# Patient Record
Sex: Female | Born: 2000 | Race: Asian | Hispanic: No | Marital: Single | Smoking: Never smoker
Health system: Southern US, Community
[De-identification: ages and names within clinical notes are randomized; demographics above are authoritative.]

## PROBLEM LIST (undated history)

## (undated) DIAGNOSIS — L709 Acne, unspecified: Secondary | ICD-10-CM

## (undated) DIAGNOSIS — R6252 Short stature (child): Secondary | ICD-10-CM

## (undated) HISTORY — DX: Acne, unspecified: L70.9

## (undated) HISTORY — DX: Short stature (child): R62.52

## (undated) HISTORY — PX: NO PAST SURGERIES: SHX2092

---
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
## 2019-07-23 NOTE — Progress Notes (Signed)
This visit occurred during the SARS-CoV-2 public health emergency.  Safety protocols were in place, including screening questions prior to the visit, additional usage of staff PPE, and extensive cleaning of exam room while observing appropriate contact time as indicated for disinfecting solutions.    Chief Complaint  Patient presents with  . Diarrhea    pt states that she has been dealing with constipation and diarrhea since july and then saturday had such sharp stomach pain after working out and has tried different diets to see if it was food    HPI: Megan Warren 19 y.o. 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 Changed diet ? If some help.  No fever weight loss blood or cramps  With this  And no travel this year . No one at hoome is sick  And had no predated diarreal illness   She also  noted   hormonal acne cheek and id diet changes  .       And last week better    .  From  all  Saturday Jan 9th had episode after a little diarrhjea.    Light headed after exercise ( had only had some celery juice that am but not with exercise)  And had acte lower abd cramps nausea and episode of  vomiting pain waxed and waned  .    Called paramedics were called    But had improved by time came  Felt  Pins and needles in hands.    VS was ok This was a High  intensity  Work out   .  Last time 3 weeks   Episode happened atCool down.    Now feels normal.   Period began that day  On time    LMp  Saturday .    Mild  cramps  A couple years ago  Had similar pain with FP in [place].   [place] stool chart   Diarrhea   Pieces  Granular.  Once in am  And then next day  Then small hard to pass.    menarche in 6 grade no hx of sever cramps  No uti vag sx and denies risk for sti .  ROS: See pertinent positives and negatives per HPI. No weight changes  And feels healthy otherwise   Past Medical History:  Diagnosis Date  . Short stature, familial     Family History  Problem Relation Age of Onset  . Short stature Unknown        sister and mother    Social History   Socioeconomic History  . Marital status: Single    Spouse name: Not on file  . Number of children: Not on file  . Years of education: Not on file  . Highest education level: Not on file  Occupational History  . Not on file  Tobacco Use  . Smoking status: Never Smoker  . Smokeless tobacco: Never Used  Substance and Sexual Activity  . Alcohol use: No  . Drug use: No  . Sexual activity: Never  Other Topics Concern  . Not on file  Social History Narrative   Born in [place]   [facility] of 2   Senior at [employer] in 2018 then plans to pursue college   Pet labrador   Neg ets firearms   Parents Vandana Daleen Bo Kilker   Mom MS pharma    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   [employer] student    Social Determinants of Health   Financial Resource Strain:   . Difficulty of Paying Living Expenses: Not on file  Food Insecurity:   . Worried About Programme researcher, broadcasting/film/video in the Last Year: Not on file  . Ran Out of Food in the Last Year: Not on file  Transportation Needs:   . Lack of Transportation (Medical): Not on file  . Lack of Transportation (Non-Medical): Not on file  Physical Activity:   . Days of Exercise per Week: Not on file  . Minutes of Exercise per Session: Not on file  Stress:   . Feeling of Stress : Not on file  Social Connections:   . Frequency of Communication with Friends and Family: Not on file  . Frequency of Social Gatherings with Friends and Family: Not on file  . Attends Religious Services: Not on file  . Active Member of  Clubs or Organizations: Not on file  . Attends Banker Meetings: Not on file  . Marital Status: Not on file    No outpatient medications prior to visit.   No facility-administered medications prior to visit.     EXAM:  BP 116/60 (BP Location: Right Arm, Patient Position: Sitting, Cuff Size: Normal)   Pulse (!) 105   Temp 98 F (36.7 C) (Temporal)   Ht 4\' 7"  (1.397 m)   Wt 73 lb 9.6 oz (33.4 kg)   LMP 07/21/2019 (Exact Date)   SpO2 98%   BMI 17.11 kg/m   Body mass index is 17.11 kg/m.  GENERAL: vitals reviewed and listed above, alert, oriented, appears well hydrated and in no acute distress HEENT: atraumatic, conjunctiva  clear, no obvious abnormalities on inspection of external nose and ears OP : masked  NECK: no obvious masses on inspection palpation  LUNGS: clear to auscultation bilaterally, no wheezes, rales or rhonchi, good air movement CV: HRRR, no clubbing cyanosis or  peripheral edema nl cap refill  Abdomen:  Sof,t normal bowel sounds without hepatosplenomegaly, no guarding rebound or masses no CVA tenderness MS: moves all extremities without noticeable focal  Abnormality Skin no excess body hair   And acne papules face few forehead no cystic changes  PSYCH: pleasant and cooperative, no obvious depression or anxiety Neuro non focal  No results found for: WBC, HGB, HCT, PLT, GLUCOSE, CHOL, TRIG, HDL, LDLDIRECT, LDLCALC, ALT, AST, NA, K, CL, CREATININE, BUN, CO2, TSH, PSA, INR, GLUF, HGBA1C, MICROALBUR BP Readings from Last 3 Encounters:  07/24/19 116/60  03/25/16 (!) 80/48 (<1 %, Z <-2.33 /  7 %, Z = -1.46)*  03/12/11 100/60 (49 %, Z = -0.02 /  47 %, Z = -0.09)*   *BP percentiles are based on the 2017 AAP Clinical Practice Guideline for girls    ASSESSMENT AND PLAN:  Discussed the following assessment and plan:  Change in bowel habits - Plan: Basic metabolic panel, CBC with Differential, Hepatic function panel, Lipid panel, TSH, T4, Free (Thyrox),  Celiac Disease Comprehensive Panel  with Reflexes, C-reactive protein, Testosterone, DHEA, Hemoccult Cards (X3 cards)  Abdominal cramps - Plan: Basic metabolic panel, CBC with Differential, Hepatic function panel, Lipid panel, TSH, T4, Free (Thyrox), Celiac Disease Comprehensive Panel with Reflexes, C-reactive protein, Testosterone, DHEA, Hemoccult Cards (X3 cards)  Acne, unspecified acne type - Plan: Basic metabolic panel, CBC with Differential, Hepatic function panel, Lipid panel, TSH, T4, Free (Thyrox), Celiac Disease Comprehensive Panel with Reflexes, C-reactive protein, Testosterone, DHEA Exam is normal today and do not suspect a significant endocrine problem. I suspect the episode this weekend was related to her.menses day 1   But cannot but  this would be different from normal dysmenorrhea for her . Her hydration status was suspect also and no cv sx with exercise .  As far as change in bowel habits nothing is alarming except it is new.  No evidence obviously of postinfectious diarrhea postinfectious irritable bowel and no systemic symptoms with it. NO recent travel in past year Can try lactose free diet we will do lab screening order stool Hemoccult to check for blood when she is not on her. Can consider GI consult as needed we will monitor her periods pain and cramping. Would not making a diagnosis of endometriosis on one episode of abdominal pain and nausea vomiting.  Blood work ordered as well as stool hemoccult to do when not on menses Outside external source  DATA REVIEWED:   Total time on date  of service including record review ordering and plan of care:   45 minutes including record review  Plan and Counseled.    -Patient advised to return or notify health care team  if  new concerns arise. In interim but plan on fu virtual and decide next step   Patient Instructions  Your exam is reassuring   Lab today  After period we can check stool for blood   Suspect the episode was period  related   Stay hydrated   If needed we can get  Gi consults  Lactose avoidance  And trial diet change may help .    Plan fu virtual visit   Depending on  Results and plan      Neta Mends. Bertin Inabinet M.D.

## 2019-07-24 ENCOUNTER — Ambulatory Visit (INDEPENDENT_AMBULATORY_CARE_PROVIDER_SITE_OTHER): Payer: No Typology Code available for payment source | Admitting: Internal Medicine

## 2019-07-24 ENCOUNTER — Other Ambulatory Visit: Payer: Self-pay

## 2019-07-24 ENCOUNTER — Encounter: Payer: Self-pay | Admitting: Internal Medicine

## 2019-07-24 VITALS — BP 116/60 | HR 105 | Temp 98.0°F | Ht <= 58 in | Wt 73.6 lb

## 2019-07-24 DIAGNOSIS — R109 Unspecified abdominal pain: Secondary | ICD-10-CM

## 2019-07-24 DIAGNOSIS — L709 Acne, unspecified: Secondary | ICD-10-CM | POA: Diagnosis not present

## 2019-07-24 DIAGNOSIS — R194 Change in bowel habit: Secondary | ICD-10-CM | POA: Diagnosis not present

## 2019-07-24 LAB — CBC WITH DIFFERENTIAL/PLATELET
Basophils Absolute: 0.1 10*3/uL (ref 0.0–0.1)
Basophils Relative: 1 % (ref 0.0–3.0)
Eosinophils Absolute: 0.3 10*3/uL (ref 0.0–0.7)
Eosinophils Relative: 5.9 % — ABNORMAL HIGH (ref 0.0–5.0)
HCT: 39.5 % (ref 36.0–46.0)
Hemoglobin: 13.4 g/dL (ref 12.0–15.0)
Lymphocytes Relative: 42 % (ref 12.0–46.0)
Lymphs Abs: 2.4 10*3/uL (ref 0.7–4.0)
MCHC: 33.8 g/dL (ref 30.0–36.0)
MCV: 88.5 fl (ref 78.0–100.0)
Monocytes Absolute: 0.5 10*3/uL (ref 0.1–1.0)
Monocytes Relative: 8.3 % (ref 3.0–12.0)
Neutro Abs: 2.4 10*3/uL (ref 1.4–7.7)
Neutrophils Relative %: 42.8 % — ABNORMAL LOW (ref 43.0–77.0)
Platelets: 286 10*3/uL (ref 150.0–400.0)
RBC: 4.47 Mil/uL (ref 3.87–5.11)
RDW: 12.8 % (ref 11.5–14.6)
WBC: 5.6 10*3/uL (ref 4.5–10.5)

## 2019-07-24 LAB — TSH: TSH: 1.64 u[IU]/mL (ref 0.35–5.50)

## 2019-07-24 LAB — HEPATIC FUNCTION PANEL
ALT: 10 U/L (ref 0–35)
AST: 14 U/L (ref 0–37)
Albumin: 4.5 g/dL (ref 3.5–5.2)
Alkaline Phosphatase: 50 U/L (ref 39–117)
Bilirubin, Direct: 0.1 mg/dL (ref 0.0–0.3)
Total Bilirubin: 0.6 mg/dL (ref 0.2–1.2)
Total Protein: 7.7 g/dL (ref 6.0–8.3)

## 2019-07-24 LAB — BASIC METABOLIC PANEL
BUN: 10 mg/dL (ref 6–23)
CO2: 26 mEq/L (ref 19–32)
Calcium: 9.8 mg/dL (ref 8.4–10.5)
Chloride: 104 mEq/L (ref 96–112)
Creatinine, Ser: 0.76 mg/dL (ref 0.40–1.20)
GFR: 96.87 mL/min (ref >60.00)
Glucose, Bld: 86 mg/dL (ref 70–99)
Potassium: 4.2 mEq/L (ref 3.5–5.1)
Sodium: 141 mEq/L (ref 135–145)

## 2019-07-24 LAB — TESTOSTERONE: Testosterone: 86.45 ng/dL — ABNORMAL HIGH (ref 10.00–70.00)

## 2019-07-24 LAB — LIPID PANEL
Cholesterol: 193 mg/dL (ref 0–200)
HDL: 75.5 mg/dL (ref >39.00)
LDL Cholesterol: 107 mg/dL — ABNORMAL HIGH (ref 0–99)
NonHDL: 117.4
Total CHOL/HDL Ratio: 3
Triglycerides: 52 mg/dL (ref 0.0–149.0)
VLDL: 10.4 mg/dL (ref 0.0–40.0)

## 2019-07-24 LAB — C-REACTIVE PROTEIN: CRP: <1 mg/dL (ref 0.5–20.0)

## 2019-07-24 LAB — T4, FREE: Free T4: 1.14 ng/dL (ref 0.60–1.60)

## 2019-07-24 NOTE — Patient Instructions (Signed)
Your exam is reassuring   Lab today  After period we can check stool for blood   Suspect the episode was period related   Stay hydrated   If needed we can get  Gi consults  Lactose avoidance  And trial diet change may help .    Plan fu virtual visit   Depending on  Results and plan

## 2019-07-27 ENCOUNTER — Telehealth: Payer: Self-pay | Admitting: *Deleted

## 2019-07-27 NOTE — Telephone Encounter (Signed)
Pt notified to bring stool samples to lab

## 2019-07-27 NOTE — Progress Notes (Signed)
So far labs good but testosterone level is slightly elevated  .   doubt causing  Gi sx but  waiting for dhea level .   Make a fu virtual visit  in  a few weeks  after results are back .

## 2019-07-27 NOTE — Telephone Encounter (Signed)
Pt given result per Dr Fabian Sharp, "So far labs good but testosterone level is slightly elevated; doubt causing Gi sx but waiting for dhea level;  Make a fu virtual visit in a few weeks after results are back"; she verbalized understanding; pt also inquired about bringing stool samples back to office; spoke with [facility] and she states they will call the pt back to discuss drop off of sample; pt notified and can be contacted at [phone]; will route to office for final dispostion.

## 2019-07-28 LAB — CELIAC DISEASE COMPREHENSIVE PANEL WITH REFLEXES
(tTG) Ab, IgA: 1 U/mL
Immunoglobulin A: 328 mg/dL — ABNORMAL HIGH (ref 47–310)

## 2019-07-28 LAB — DHEA: DHEA: 779 ng/dL

## 2019-09-14 ENCOUNTER — Telehealth: Payer: Self-pay | Admitting: Internal Medicine

## 2019-09-14 NOTE — Telephone Encounter (Signed)
Spoke with patient she is going to come back and get more cards

## 2019-09-14 NOTE — Addendum Note (Signed)
Addended by: Bonnye Fava on: 09/14/2019 03:02 PM   Modules accepted: Orders

## 2019-09-14 NOTE — Telephone Encounter (Signed)
Pt is requesting results from sample that she brought in back in February 2021.

## 2019-09-18 LAB — HEMOCCULT SLIDES (X 3 CARDS)
Fecal Occult Blood: NEGATIVE
OCCULT 1: NEGATIVE
OCCULT 2: NEGATIVE
OCCULT 3: NEGATIVE
OCCULT 4: NEGATIVE
OCCULT 5: NEGATIVE

## 2019-09-18 NOTE — Progress Notes (Signed)
Virtual Visit via Video Note  I connected with@ on 09/19/19 at 11:30 AM EST by a video enabled telemedicine application and verified that I am speaking with the correct person using two identifiers. Location patient: home Location provider:work office Persons participating in the virtual visit: patient, provider  WIth national recommendations  regarding COVID 19 pandemic   video visit is advised over in office visit for this patient.  Patient aware  of the limitations of evaluation and management by telemedicine and  availability of in person appointments. and agreed to proceed.   HPI: Clarence Dunsmore presents for video visit   F/u  Since last visit she has had off and on diarrhea but no "accidents"    No blood  . She saw dermatologist who gave her topical retinoid but not address the elevated testosterone. Has normal periods no hx of cysts   Or other significant menstrual problems   ROS: See pertinent positives and negatives per HPI.  Past Medical History:  Diagnosis Date  . Short stature, familial     History reviewed. No pertinent surgical history.  Family History  Problem Relation Age of Onset  . Short stature Unknown        sister and mother    Social History   Tobacco Use  . Smoking status: Never Smoker  . Smokeless tobacco: Never Used  Substance Use Topics  . Alcohol use: No  . Drug use: No      Current Outpatient Medications:  .  metroNIDAZOLE (FLAGYL) 250 MG tablet, Take 1 tablet (250 mg total) by mouth 3 (three) times daily., Disp: 30 tablet, Rfl: 0  EXAM: BP Readings from Last 3 Encounters:  07/24/19 116/60  03/25/16 (!) 80/48 (<1 %, Z <-2.33 /  7 %, Z = -1.46)*  03/12/11 100/60 (49 %, Z = -0.02 /  47 %, Z = -0.09)*   *BP percentiles are based on the 2017 AAP Clinical Practice Guideline for girls   Wt Readings from Last 3 Encounters:  07/24/19 73 lb 9.6 oz (33.4 kg)  03/25/16 69 lb 11.2 oz (31.6 kg) (<1 %, Z= -5.84)*  03/12/11 62 lb (28.1 kg)  (2 %, Z= -2.12)*   * Growth percentiles are based on CDC (Girls, 2-20 Years) data.     VITALS per patient if applicable: weight reported as 73 #  GENERAL: alert, oriented, appears well and in no acute distress looks well   HEENT: atraumatic, conjunttiva clear, no obvious abnormalities on inspection of external nose and ears  NECK: normal movements of the head and neck  LUNGS: on inspection no signs of respiratory distress, breathing rate appears normal, no obvious gross SOB, gasping or wheezing  CV: no obvious cyanosis  PSYCH/NEURO: pleasant and cooperative, no obvious depression or anxiety, speech and thought processing grossly intact Lab Results  Component Value Date   WBC 5.6 07/24/2019   HGB 13.4 07/24/2019   HCT 39.5 07/24/2019   PLT 286.0 07/24/2019   GLUCOSE 86 07/24/2019   CHOL 193 07/24/2019   TRIG 52.0 07/24/2019   HDL 75.50 07/24/2019   LDLCALC 107 (H) 07/24/2019   ALT 10 07/24/2019   AST 14 07/24/2019   NA 141 07/24/2019   K 4.2 07/24/2019   CL 104 07/24/2019   CREATININE 0.76 07/24/2019   BUN 10 07/24/2019   CO2 26 07/24/2019   TSH 1.64 07/24/2019   Lab  normal except slightly elevated IGA   5.9 % eos on cbc but nl absolute  eos Stool  Hemoccult negative   ASSESSMENT AND PLAN:  Discussed the following assessment and plan:    ICD-10-CM   1. Elevated testosterone level  R79.89 Ambulatory referral to Internal Medicine  2. Acne, unspecified acne type  L70.9 Ambulatory referral to Internal Medicine   Empiric rx  Metronidazole for poss post infectious sibo etc   If not better then get GI eval for more work up  For dx .  Or as needed send in messaging about  How doing after med.to decide on next step.  Although testosterone  Only mildly elevated   She has acne new onset and no gyne sx  Will get  Endocrine consult   ? If a candidate for anti estrogen meds or other for her acne also  Counseled.   Expectant management and discussion of plan and treatment  with opportunity to ask questions and all were answered. The patient agreed with the plan and demonstrated an understanding of the instructions.   Advised to call back or seek an in-person evaluation if worsening  or having  further concerns . In interim  Return for reports after   medication rx  as to status .  Berniece Andreas, MD

## 2019-09-19 ENCOUNTER — Telehealth (INDEPENDENT_AMBULATORY_CARE_PROVIDER_SITE_OTHER): Payer: 59 | Admitting: Internal Medicine

## 2019-09-19 ENCOUNTER — Encounter: Payer: Self-pay | Admitting: Internal Medicine

## 2019-09-19 ENCOUNTER — Other Ambulatory Visit: Payer: Self-pay

## 2019-09-19 DIAGNOSIS — R7989 Other specified abnormal findings of blood chemistry: Secondary | ICD-10-CM

## 2019-09-19 DIAGNOSIS — L709 Acne, unspecified: Secondary | ICD-10-CM | POA: Diagnosis not present

## 2019-09-19 MED ORDER — METRONIDAZOLE 250 MG PO TABS: 250.0000 mg | ORAL_TABLET | Freq: Three times a day (TID) | ORAL | 0 refills | Status: DC

## 2019-10-26 ENCOUNTER — Encounter: Payer: Self-pay | Admitting: Internal Medicine

## 2019-10-26 ENCOUNTER — Ambulatory Visit (INDEPENDENT_AMBULATORY_CARE_PROVIDER_SITE_OTHER): Payer: 59 | Admitting: Internal Medicine

## 2019-10-26 ENCOUNTER — Ambulatory Visit: Payer: 59 | Admitting: Internal Medicine

## 2019-10-26 ENCOUNTER — Other Ambulatory Visit: Payer: Self-pay

## 2019-10-26 VITALS — BP 110/62 | HR 78 | Ht <= 58 in | Wt 71.6 lb

## 2019-10-26 DIAGNOSIS — L709 Acne, unspecified: Secondary | ICD-10-CM | POA: Diagnosis not present

## 2019-10-26 DIAGNOSIS — R7989 Other specified abnormal findings of blood chemistry: Secondary | ICD-10-CM | POA: Diagnosis not present

## 2019-10-26 NOTE — Progress Notes (Addendum)
Patient ID: Megan Warren, female   DOB: 03-10-2001, 19 y.o.   MRN: [id number]   This visit occurred during the SARS-CoV-2 public health emergency.  Safety protocols were in place, including screening questions prior to the visit, additional usage of staff PPE, and extensive cleaning of exam room while observing appropriate contact time as indicated for disinfecting solutions.   HPI: Megan Warren is a 19 y.o. female, referred by Dr. Regis Bill, for evaluation for acne and elevated testosterone.  Patient describes that in 12/17/2018 she developed fulminant acne on face, a totally new occurrence for her.  She had some mild acne spots in high school, but not recently.  She started to use several cosmetic products with no significant improvement.  Afterwards, she stopped eating dairy, pasta, white rice and acne improved significantly.  She did see dermatology afterwards, in 09/2019, and she was given topical retinoid.  She does not feel that she is benefiting significantly from this.  She also saw PCP who checked a total testosterone level that returned slightly high.  She was referred to endocrinology for possible PCOS.  She also describes that around the same time with her acne onset, she developed diarrhea.  She initially had alternating diarrhea with constipation, but afterwards, she only had diarrhea.  She continues to have this.  This is quite bothersome.  She tried metronidazole but this did not help.  She does remember that in 06/2017 when she was in [place] she had an episode of gastroenteritis.  She did not start having diarrhea right away, though. She actually thought that her appointment with me is also for diarrhea.  I explained that she will need to see gastroenterology for this problem instead.  Fertility/Menstrual cycles: - Regular menses; LMP: 10/17/2019. - no h/o ovarian cysts - children: 0 - miscarriages: 0 - contraception: no, she is not sexually active  Acne: -New onset -  06-01/2019 >> improved with change in diet -She saw dermatology-now on topical retinoid - started 09/2019  Hirsutism: - no  Weight gain: - no - no steroid use - no weight loss meds  She is not on OCPs  Reviewed pertinent tests:  She had a slightly high testosterone level and a normal DHEA-S: Component     Latest Ref Rng & Units 07/24/2019  Testosterone     10.00 - 70.00 ng/dL 86.45 (H)  DHEA-Sulfate, LCMS     ng/dL 779   -She had normal thyroid tests: Lab Results  Component Value Date   TSH 1.64 07/24/2019   FREET4 1.14 07/24/2019   -+ HL; last set of lipids:    Component Value Date/Time   CHOL 193 07/24/2019 0913   TRIG 52.0 07/24/2019 0913   HDL 75.50 07/24/2019 0913   CHOLHDL 3 07/24/2019 0913   VLDL 10.4 07/24/2019 0913   LDLCALC 107 (H) 07/24/2019 0913   - Last HbA1c: No results found for: HGBA1C   For exercise, she does yoga every night.  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  ROS: Constitutional: no weight gain, no fatigue, no subjective hyperthermia/hypothermia Eyes: no blurry vision, no xerophthalmia ENT: no sore throat, no nodules palpated in neck, no dysphagia/odynophagia, no hoarseness Cardiovascular: no CP/SOB/palpitations/leg swelling Respiratory: no cough/SOB Gastrointestinal: no N/V/+ D/no C Musculoskeletal: no muscle/joint aches Skin: + Significant acne, no excess hair on face Neurological: no tremors/numbness/tingling/dizziness Psychiatric: no depression/anxiety  Past Medical History:  Diagnosis Date  . Short stature, familial    History reviewed. No pertinent surgical history. Social History  Socioeconomic History  . Marital status: Single    Spouse name: Not on file  . Number of children: Not on file  . Years of education: Not on file  . Highest education level: Not on file  Occupational History  . Not on file  Tobacco Use  . Smoking status: Never Smoker  . Smokeless tobacco: Never Used  Substance and Sexual Activity   . Alcohol use: No  . Drug use: No  . Sexual activity: Never  Other Topics Concern  . Not on file  Social History Narrative   Born in [place]   [facility] of [facility]   Senior at [employer] in 2018 then plans to pursue college   Pet labrador   Neg ets firearms   Parents Vandana Daleen Bo Frady   Mom MS pharma    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   [employer] student    Social Determinants of Health   Financial Resource Strain:   . Difficulty of Paying Living Expenses:   Food Insecurity:   . Worried About Programme researcher, broadcasting/film/video in the Last Year:   . Barista in the Last Year:   Transportation Needs:   . Freight forwarder (Medical):   Marland Kitchen Lack of Transportation (Non-Medical):   Physical Activity:   . Days of Exercise per Week:   . Minutes of Exercise per Session:   Stress:   . Feeling of Stress :   Social Connections:   . Frequency of Communication with Friends and Family:   . Frequency of Social Gatherings with Friends and Family:   . Attends Religious Services:   . Active Member of Clubs or Organizations:   . Attends Banker Meetings:   Marland Kitchen Marital Status:   Intimate Partner Violence:   . Fear of Current or Ex-Partner:   . Emotionally Abused:   Marland Kitchen Physically Abused:   . Sexually Abused:    Current Outpatient Medications on File Prior to Visit  Medication Sig Dispense Refill  . tretinoin (RETIN-A) 0.025 % cream APPLY DIME SIZE AMOUNT TO ENTIRE FACE AS TOLERATED NIGHTLY     No current facility-administered medications on file prior to visit.   No Known Allergies Family History  Problem Relation Age of Onset  . Short stature Unknown        sister and mother   PE: BP 110/62   Pulse 78   Ht 4\' 7"  (1.397 m)   Wt 71 lb 9.6 oz (32.5 kg)   LMP 10/17/2019   SpO2 97%   BMI 16.64 kg/m  Wt Readings from Last 3 Encounters:  10/26/19 71 lb 9.6 oz (32.5 kg)  07/24/19 73 lb 9.6 oz (33.4 kg)  03/25/16 69 lb 11.2 oz (31.6 kg) (<1 %, Z= -5.84)*   * Growth percentiles are  based on CDC (Girls, 2-20 Years) data.   Constitutional: Thin, in NAD, no full supraclavicular fat pads Eyes: PERRLA, EOMI, no exophthalmos ENT: moist mucous membranes, no thyromegaly, no cervical lymphadenopathy Cardiovascular: RRR, No MRG Respiratory: CTA B Gastrointestinal: abdomen soft, NT, ND, BS+ Musculoskeletal: no deformities, strength intact in all 4 Skin: moist, warm; + acne on face, no dark terminal hair on chin, + vellum on sideburns, no skin tags, no acanthosis nigricans, no purple, wide, stretch marks Neurological: no tremor with outstretched hands, DTR normal in all 4  ASSESSMENT: 1. Acne  2.  Elevated testosterone  PLAN: 1. Acne and 2. Elevated testosterone - I had a long discussion with the patient  about the fact that the PCOS is a misnomer, a patient does not necessarily have to have polycystic ovaries to be diagnosed with the disorder but this can be a feature of the syndrome. The PCOS syndrome is caused by a dysfunction in the pituitary gonadal pulse generator in which LH and FSH hormones are secreted in pulses with an abnormal frequency.  As a consequence, patients may have an excess of estrogen and testosterone. - These hormonal abnormalities can result in a sum of several conditions, including:  weight gain  insulin resistance (and therefore a higher risk of developing diabetes later in life)  acne  hirsutism  irregular menstrual cycles  decreased fertility. - We also discussed about the fact that the treatment is usually targeted to addressing the problem that concerns the patient the most: acne/hirsutism, weight gain, or fertility, but there is no single treatment for PCOS.  - The first-line therapy are oral contraceptives. If she is concerned with her weight, we can use metformin; if she is concerned about acne/hirsutism, we can add spironolactone; and if she is concerned about fertility, I could refer her to reproductive endocrinology for possible use of  clomiphene. - in her case, she does have acne which had a rapid onset, without a clear trigger she did have a slightly elevated total testosterone level.  However, she does not have weight gain, irregular menstrual cycles or any signs of insulin resistance.  Therefore, she does not meet more than 2 of the [place] criteria for PCOS (irregular menstrual cycles, biochemical or clinical hyperandrogenism, polycystic ovaries on ultrasound).  She did not have an ovarian ultrasound but she is not sexually active and would like to avoid getting a transvaginal ultrasound from her for now. We discussed that a high testosterone level may have been related to ovulation.  Therefore, I will have her back for a repeat free testosterone along with other pertinent labs to investigate her hyperandrogenism in the 3rd-5th days of her next menstrual cycle.  At that time, we will check: Orders Placed This Encounter  Procedures  . 17-Hydroxyprogesterone  . Testosterone Free with SHBG  . Prolactin  . Luteinizing hormone  . Follicle stimulating hormone  . Estradiol  -After this, I suggested to try an oral contraceptive to see if this helps with her acne.  If it does not, I would suggest to stop it in 6 months.  She agrees with the plan. -In the meantime, continue to see dermatology and she will also need to see GI for her now chronic diarrhea.  Component     Latest Ref Rng & Units 12/18/2019  Testosterone, Serum (Total)     ng/dL 26  % Free Testosterone     % 1.2  Free Testosterone, S     pg/mL 3.1  Sex Hormone Binding Globulin     nmol/L 43.3  Estradiol, Free     pg/mL 0.21  Estradiol     pg/mL 9  FSH     mIU/ML 11.6  LH     mIU/mL 3.76  Prolactin     ng/mL 10.0  17-OH-Progesterone, LC/MS/MS     ng/dL 60  No biochemical evidence of PCOS no hyperprolactinemia, no CAH.  At this point, we do not have a clear evidence of PCOS for her, but we could not check a transvaginal ultrasound.  I would still suggest  an oral contraceptive at this to try and see if her acne improves.  Since pt has such a small frame >>  Lo Lo Estrin.    Carlus Pavlov, MD PhD [facility] Endocrinology

## 2019-10-26 NOTE — Patient Instructions (Signed)
Please schedule a lab appt in the 3rd-5th day of your menstrual cycle.  Please come back for a follow-up appointment in 4 months.

## 2019-10-29 ENCOUNTER — Other Ambulatory Visit: Payer: Self-pay

## 2019-10-29 DIAGNOSIS — R197 Diarrhea, unspecified: Secondary | ICD-10-CM

## 2019-10-29 NOTE — Telephone Encounter (Signed)
Yes I agree  Please do referral to GI  For ongoing   Diarrhea

## 2019-11-09 ENCOUNTER — Ambulatory Visit (INDEPENDENT_AMBULATORY_CARE_PROVIDER_SITE_OTHER): Payer: 59 | Admitting: Nurse Practitioner

## 2019-11-09 ENCOUNTER — Other Ambulatory Visit: Payer: 59

## 2019-11-09 ENCOUNTER — Encounter: Payer: Self-pay | Admitting: Nurse Practitioner

## 2019-11-09 VITALS — BP 90/58 | HR 112 | Temp 98.3°F | Ht <= 58 in | Wt 72.2 lb

## 2019-11-09 DIAGNOSIS — K529 Noninfective gastroenteritis and colitis, unspecified: Secondary | ICD-10-CM | POA: Diagnosis not present

## 2019-11-09 DIAGNOSIS — R197 Diarrhea, unspecified: Secondary | ICD-10-CM

## 2019-11-09 NOTE — Progress Notes (Signed)
11/09/2019 Hartley Wyke [id number] Dec 18, 2000   CHIEF COMPLAINT: Diarrhea   HISTORY OF PRESENT ILLNESS:  Patricia Fargo is a 19 year. She was referred to our office by Dr. Berniece Andreas for further evaluation regarding chronic diarrhea. She complains of having diarrhea which started abruptly around August 2020. She describes her diarrhea as loose to soft stool " [place] 5", 1 to 3 episodes daily. Prior to August, she passed a firm solid brown stool daily most days but sometimes had alternating constipation and loose stools. No history of rectal bleeding or melena. No mucous per the rectum. No associated upper or lower abdominal pain. No bloat. No antibiotics within the past year. She traveled to [place] in 2018 and developed food poisoning resulting in diarrhea. She was evaluated by a physician in [place], no antibiotics were required and her diarrhea resolved within 1 week. She is eating a bland diet but reports eating more vegetables, limited dairy including yogurt daily. Her only medication is Retin A which she takes for acne which also started Aug 2020. Her acne somewhat improved after she eliminated most dairy, pasta and white rice from her diet.  She was seen by her PCP one month ago, heme slides were negative. tTg was normal with slightly elevated IgA level. Normal CRP, TSH, CBC and CMP levels. She was prescribed Flagyl for 1 week without improvement. No supplements or herbal remedies. No NSAID use. She just started taking a probiotic 2 days ago. She denies having any weight loss. She weighs 72 lbs with known short stature. BMI 16.7. She reports her mother and sister also have short stature.  No history of an eating disorder. BP is low 90/58 which she stated is her normal BP. HR 112. She reports feeling anxious at this time. She denies having a history of anxiety. No caffeine intake. She was exercising with her mother in Jan. 2021, she developed nausea and lower abdominal pain. She called 911  and by the time the EMS arrived her symptoms nearly resolved without recurrence since then. LMP 4/7. She reported possibly having PCOS. Due to having unusual acne with elevated testosterone levels she was seen by endocrinologist Dr. Ernest Haber. Repeat testosterone levels were ordered, future oral contraceptive use was recommended and follow up with dermatology was planned. No family history of celiac disease, IBD, gastric or colon cancer.   CBC Latest Ref Rng & Units 07/24/2019  WBC 4.5 - 10.5 K/uL 5.6  Hemoglobin 12.0 - 15.0 g/dL 96.2  Hematocrit 22.9 - 46.0 % 39.5  Platelets 150.0 - 400.0 K/uL 286.0  Eos 5.9 Absolute Eos 0.3  CMP Latest Ref Rng & Units 07/24/2019  Glucose 70 - 99 mg/dL 86  BUN 6 - 23 mg/dL 10  Creatinine 7.98 - 9.21 mg/dL 1.94  Sodium 174 - 081 mEq/L 141  Potassium 3.5 - 5.1 mEq/L 4.2  Chloride 96 - 112 mEq/L 104  CO2 19 - 32 mEq/L 26  Calcium 8.4 - 10.5 mg/dL 9.8  Total Protein 6.0 - 8.3 g/dL 7.7  Total Bilirubin 0.2 - 1.2 mg/dL 0.6  Alkaline Phos 39 - 117 U/L 50  AST 0 - 37 U/L 14  ALT 0 - 35 U/L 10  CRP < 1.  TSH 1.64.  Testosterone 86.45 DHEA 779 IgA 328 (47-310) Heme cards x 5 negative    Past Medical History:  Diagnosis Date  . Acne   . Short stature, familial    Past Surgical History:  Procedure Laterality Date  . 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 Nonsmoker. No alcohol. No drug use.   Family History: Mother age 82 healthy short stature. Father age 79 healthy. Sister 24 healthy short stature.   No Known Allergies   Outpatient Encounter Medications as of 11/09/2019  Medication Sig  . tretinoin (RETIN-A) 0.025 % cream APPLY DIME SIZE AMOUNT TO ENTIRE FACE AS TOLERATED NIGHTLY   No facility-administered encounter medications on file as of 11/09/2019.     REVIEW OF SYSTEMS: All other systems reviewed and negative except where noted in the History of Present  Illness.   PHYSICAL EXAM: BP (!) 90/58 (BP Location: Left Arm, Patient Position: Sitting, Cuff Size: Normal)   Pulse (!) 112   Temp 98.3 F (36.8 C)   Ht 4\' 7"  (1.397 m) Comment: height measured without shoes  Wt 72 lb 4 oz (32.8 kg)   LMP 10/17/2019   BMI 16.79 kg/m  General: Petite thin female with a short stature in NAD.  Head: Normocephalic and atraumatic. Eyes:  Sclerae non-icteric, conjunctive pink. Ears: Normal auditory acuity. Mouth: Dentition intact. No ulcers or lesions.  Neck: Supple, no lymphadenopathy or thyromegaly.  Lungs: Clear bilaterally to auscultation without wheezes, crackles or rhonchi. Heart: Regular rate and rhythm. No murmur, rub or gallop appreciated.  Abdomen: Soft, flat, non distended. Nontender.  No masses. No hepatosplenomegaly. Normoactive bowel sounds x 4 quadrants.  Rectal: Deferred.  Musculoskeletal: Symmetrical with no gross deformities. Skin: Warm and dry. No rash or lesions on visible extremities. Extremities: No edema. Neurological: Alert oriented x 4, no focal deficits.  Psychological:  Alert and cooperative. Normal mood and affect.  ASSESSMENT AND PLAN:  55. 19 year old female with chronic painless nonbloody diarrhea  -Stool culture, O & P., C. Diff (recently took Flagyl po x 7 days, unlikely c.diff but will check to be complete) -Probiotic of choice once daily -Benefiber 1 tablespoon daily  -EatgGreen bananas/dried apple chips (contain pectin) to firm up stool -May consider SIBO breath test  -Discussed scheduling a diagnostic EGD to rule out celiac disease, diagnostic colonoscopy to rule out colitis/IBD if symptoms persist or worsen.  -Follow up in the office with Dr. Loletha Carrow in 8 weeks or earlier if symptoms worsen -Further recommendations to be determined after the above stool tests results completed   2. Elevated Testosterone level and acne -Continue with endocrinology and dermatology follow up        CC:  Panosh, Standley Brooking,  MD

## 2019-11-09 NOTE — Patient Instructions (Signed)
If you are age 21 or older, your body mass index should be between 23-30. Your Body mass index is 16.79 kg/m. If this is out of the aforementioned range listed, please consider follow up with your Primary Care Provider.  If you are age 67 or younger, your body mass index should be between 19-25. Your Body mass index is 16.79 kg/m. If this is out of the aformentioned range listed, please consider follow up with your Primary Care Provider.   1. Probiotic of choice once daily 2. Use benefiber 1 tbsp daily to bulk up stool if tolerated. 3. Use green bananas, dried apple chips. 4. Follow up with Dr Myrtie Neither in 2 months  Your provider has requested that you go to the basement level for lab work before leaving today. Press "B" on the elevator. The lab is located at the first door on the left as you exit the elevator.  Due to recent changes in healthcare laws, you may see the results of your imaging and laboratory studies on MyChart before your provider has had a chance to review them.  We understand that in some cases there may be results that are confusing or concerning to you. Not all laboratory results come back in the same time frame and the provider may be waiting for multiple results in order to interpret others.  Please give [place] 48 hours in order for your provider to thoroughly review all the results before contacting the office for clarification of your results.   Thank you for choosing [facility] Arnaldo Natal, CRNP

## 2019-11-10 DIAGNOSIS — K529 Noninfective gastroenteritis and colitis, unspecified: Secondary | ICD-10-CM | POA: Insufficient documentation

## 2019-11-12 ENCOUNTER — Other Ambulatory Visit: Payer: 59

## 2019-11-12 DIAGNOSIS — R197 Diarrhea, unspecified: Secondary | ICD-10-CM

## 2019-11-13 NOTE — Progress Notes (Signed)
____________________________________________________________  Attending physician addendum:  Thank you for sending this case to me. I have reviewed the entire note, and the outlined plan seems appropriate.  She might benefit from a trial of anti-spasmodic such as dicyclomine or hyoscyamine.  Amada Jupiter, MD  ____________________________________________________________

## 2019-11-14 ENCOUNTER — Other Ambulatory Visit: Payer: Self-pay

## 2019-11-14 ENCOUNTER — Telehealth: Payer: Self-pay | Admitting: General Surgery

## 2019-11-14 MED ORDER — VANCOMYCIN HCL 125 MG PO CAPS: 125.0000 mg | ORAL_CAPSULE | Freq: Four times a day (QID) | ORAL | 0 refills | Status: DC

## 2019-11-14 MED ORDER — SACCHAROMYCES BOULARDII 250 MG PO CAPS: 250.0000 mg | ORAL_CAPSULE | Freq: Two times a day (BID) | ORAL | 0 refills | Status: AC

## 2019-11-14 NOTE — Telephone Encounter (Signed)
See lab notes with RX for  Vanco, florastor and follow up. Msg was sent to [facility].

## 2019-11-14 NOTE — Telephone Encounter (Signed)
This will need to go to [facility] nurse I will send to [facility]

## 2019-11-14 NOTE — Telephone Encounter (Signed)
Results are in and reviewed by Alcide Evener, RNP.  Orders are placed.  Awaiting a call back from the patient

## 2019-11-14 NOTE — Telephone Encounter (Signed)
Call received from Roman with [facility]. The patients c Diff is positive. They are faxing over the results.

## 2019-11-16 LAB — STOOL CULTURE
MICRO NUMBER:: 10431473
MICRO NUMBER:: 10431474
MICRO NUMBER:: 10431476
SHIGA RESULT:: NOT DETECTED
SPECIMEN QUALITY:: ADEQUATE
SPECIMEN QUALITY:: ADEQUATE
SPECIMEN QUALITY:: ADEQUATE

## 2019-11-16 LAB — CLOSTRIDIUM DIFFICILE TOXIN B, QUALITATIVE, REAL-TIME PCR: Toxigenic C. Difficile by PCR: DETECTED — AB

## 2019-11-16 LAB — OVA AND PARASITE EXAMINATION
CONCENTRATE RESULT:: NONE SEEN
MICRO NUMBER:: 10431475
SPECIMEN QUALITY:: ADEQUATE
TRICHROME RESULT:: NONE SEEN

## 2019-12-07 ENCOUNTER — Telehealth: Payer: Self-pay | Admitting: Nurse Practitioner

## 2019-12-07 NOTE — Telephone Encounter (Signed)
So  We can surely refer but cannot guarantee sure you will get appt  Sooner  As a new patient. Advise taking a pro biotic  If not already adding this    Megan Warren see if they can see her at  [facility] sooner since she was supposed to have a follow up  And the [facility] office did not contact her .  If not can do referral to Dr Loreta Ave

## 2019-12-11 NOTE — Telephone Encounter (Signed)
Spoke with the patient. She has continued to have intermittent LLQ pain. Describes this as a sharp pain "pretty severe." Afebrile. Stools are soft but not liquid. Per Alcide Evener, the patient needs to be evaluated in office. Appointment scheduled.

## 2019-12-14 ENCOUNTER — Encounter: Payer: Self-pay | Admitting: Nurse Practitioner

## 2019-12-14 ENCOUNTER — Ambulatory Visit (INDEPENDENT_AMBULATORY_CARE_PROVIDER_SITE_OTHER): Payer: No Typology Code available for payment source | Admitting: Nurse Practitioner

## 2019-12-14 DIAGNOSIS — A0472 Enterocolitis due to Clostridium difficile, not specified as recurrent: Secondary | ICD-10-CM

## 2019-12-14 NOTE — Progress Notes (Signed)
     12/14/2019 Kassiah Mccrory [id number] 07/28/2000   Chief Complaint: follow up c. Diff infection   History of Present Illness: Megan Warren is a 19 year female who I saw in the office on 11/09/2019 with persistent diarrhea. C. difficile PCR was positive. O&P was negative. She was treated with vancomycin 125 mg 1 p.o. 4 times daily for 2 weeks. Florastor probiotic was recommended however, she obtained an alternative bacteria probiotic which she is taking once daily. She stated her diarrhea abated within 1 week of taking the vancomycin. She completed the 14-day course of vancomycin without recurrence of diarrhea. She is passing a normal brown bowel movement once or twice daily. She reported having 2 episodes of brief left lower quadrant discomfort which abated within a few minutes. Further lower abdominal pain. No other complaints today.    Current Medications, Allergies, Past Medical History, Past Surgical History, Family History and Social History were reviewed in [employer] record.   Physical Exam: BP (!) 116/54   Pulse (!) 118   Ht 4' 9.5" (1.461 m)   Wt 73 lb (33.1 kg)   LMP 11/15/2019 (Approximate)   SpO2 96%   BMI 15.52 kg/m  General: 19 year old petite female in no acute distress. Lungs: Clear throughout to auscultation. Heart: Regular rate and rhythm, no murmur. Abdomen: Soft, nontender and nondistended. No masses or hepatomegaly. Normal bowel sounds x 4 quadrants.  Rectal: Deferred. Musculoskeletal: Symmetrical with no gross deformities. Extremities: No edema. Neurological: Alert oriented x 4. No focal deficits.  Psychological: Alert and cooperative. Normal mood and affect  Assessment and Recommendations:  1. 19 year old female with C. difficile diarrhea resolved after completing 14-day course of vancomycin. -Continue probiotic of choice for the next few weeks -I advised the patient to take Florastor use probiotic 1 p.o. twice daily for 2  to 4  weeks anytime she is prescribed an antibiotic in the future. -She will call her office if her diarrhea recurs

## 2019-12-14 NOTE — Patient Instructions (Addendum)
If you are age 21 or older, your body mass index should be between 23-30. Your Body mass index is 15.52 kg/m. If this is out of the aforementioned range listed, please consider follow up with your Primary Care Provider.  If you are age 59 or younger, your body mass index should be between 19-25. Your Body mass index is 15.52 kg/m. If this is out of the aformentioned range listed, please consider follow up with your Primary Care Provider.   Take Florastore Probiotic 1 capsule twice daily any time that you are taking antibiotics.  Call the office if diarrhea reoccurs.  Follow up as needed.

## 2019-12-18 ENCOUNTER — Other Ambulatory Visit (INDEPENDENT_AMBULATORY_CARE_PROVIDER_SITE_OTHER): Payer: No Typology Code available for payment source

## 2019-12-18 ENCOUNTER — Other Ambulatory Visit: Payer: Self-pay

## 2019-12-18 DIAGNOSIS — L709 Acne, unspecified: Secondary | ICD-10-CM | POA: Diagnosis not present

## 2019-12-18 DIAGNOSIS — R7989 Other specified abnormal findings of blood chemistry: Secondary | ICD-10-CM | POA: Diagnosis not present

## 2019-12-18 LAB — LUTEINIZING HORMONE: LH: 3.76 m[IU]/mL

## 2019-12-18 LAB — FOLLICLE STIMULATING HORMONE: FSH: 11.6 m[IU]/mL

## 2019-12-18 NOTE — Progress Notes (Signed)
____________________________________________________________  Attending physician addendum:  Thank you for sending this case to me. I have reviewed the entire note, and the outlined plan seems appropriate.  Good pick-up on C diff in a young healthy person.  Amada Jupiter, MD  ____________________________________________________________

## 2019-12-22 LAB — ESTRADIOL, FREE
Estradiol, Free: 0.21 pg/mL
Estradiol: 9 pg/mL

## 2019-12-22 LAB — 17-HYDROXYPROGESTERONE: 17-OH-Progesterone, LC/MS/MS: 60 ng/dL

## 2019-12-22 LAB — PROLACTIN: Prolactin: 10 ng/mL

## 2019-12-25 LAB — TESTOSTERONE, FREE AND TOTAL (INCLUDES SHBG)-(MALES)
% Free Testosterone: 1.2 %
Free Testosterone, S: 3.1 pg/mL
Sex Hormone Binding Globulin: 43.3 nmol/L
Testosterone, Serum (Total): 26 ng/dL

## 2019-12-25 MED ORDER — LO LOESTRIN FE 1 MG-10 MCG / 10 MCG PO TABS: 1.0000 | ORAL_TABLET | Freq: Every day | ORAL | 11 refills | Status: DC

## 2019-12-25 NOTE — Addendum Note (Signed)
Addended by: Carlus Pavlov on: 12/25/2019 12:42 PM   Modules accepted: Orders

## 2020-01-04 ENCOUNTER — Ambulatory Visit: Payer: 59 | Admitting: Nurse Practitioner

## 2020-02-07 ENCOUNTER — Encounter: Payer: Self-pay | Admitting: Internal Medicine

## 2020-02-29 ENCOUNTER — Ambulatory Visit: Payer: 59 | Admitting: Internal Medicine

## 2020-03-31 ENCOUNTER — Ambulatory Visit: Payer: 59 | Admitting: Internal Medicine

## 2021-10-27 ENCOUNTER — Encounter: Payer: Self-pay | Admitting: Internal Medicine

## 2021-10-27 ENCOUNTER — Ambulatory Visit (INDEPENDENT_AMBULATORY_CARE_PROVIDER_SITE_OTHER): Payer: No Typology Code available for payment source | Admitting: Internal Medicine

## 2021-10-27 VITALS — BP 110/80 | HR 86 | Temp 98.7°F | Ht <= 58 in | Wt 76.2 lb

## 2021-10-27 DIAGNOSIS — Z973 Presence of spectacles and contact lenses: Secondary | ICD-10-CM

## 2021-10-27 DIAGNOSIS — Z0184 Encounter for antibody response examination: Secondary | ICD-10-CM

## 2021-10-27 DIAGNOSIS — Z13220 Encounter for screening for lipoid disorders: Secondary | ICD-10-CM

## 2021-10-27 DIAGNOSIS — Z1159 Encounter for screening for other viral diseases: Secondary | ICD-10-CM | POA: Diagnosis not present

## 2021-10-27 DIAGNOSIS — Z0000 Encounter for general adult medical examination without abnormal findings: Secondary | ICD-10-CM | POA: Diagnosis not present

## 2021-10-27 LAB — LIPID PANEL
Cholesterol: 250 mg/dL — ABNORMAL HIGH (ref 0–200)
HDL: 95.6 mg/dL (ref >39.00)
LDL Cholesterol: 144 mg/dL — ABNORMAL HIGH (ref 0–99)
NonHDL: 154.5
Total CHOL/HDL Ratio: 3
Triglycerides: 54 mg/dL (ref 0.0–149.0)
VLDL: 10.8 mg/dL (ref 0.0–40.0)

## 2021-10-27 LAB — COMPREHENSIVE METABOLIC PANEL
ALT: 11 U/L (ref 0–35)
AST: 17 U/L (ref 0–37)
Albumin: 4.5 g/dL (ref 3.5–5.2)
Alkaline Phosphatase: 41 U/L (ref 39–117)
BUN: 10 mg/dL (ref 6–23)
CO2: 26 mEq/L (ref 19–32)
Calcium: 9.5 mg/dL (ref 8.4–10.5)
Chloride: 103 mEq/L (ref 96–112)
Creatinine, Ser: 0.67 mg/dL (ref 0.40–1.20)
GFR: 124.07 mL/min (ref >60.00)
Glucose, Bld: 79 mg/dL (ref 70–99)
Potassium: 3.9 mEq/L (ref 3.5–5.1)
Sodium: 138 mEq/L (ref 135–145)
Total Bilirubin: 1 mg/dL (ref 0.2–1.2)
Total Protein: 8 g/dL (ref 6.0–8.3)

## 2021-10-27 LAB — CBC WITH DIFFERENTIAL/PLATELET
Basophils Absolute: 0 10*3/uL (ref 0.0–0.1)
Basophils Relative: 0.7 % (ref 0.0–3.0)
Eosinophils Absolute: 0 10*3/uL (ref 0.0–0.7)
Eosinophils Relative: 0.6 % (ref 0.0–5.0)
HCT: 38.7 % (ref 36.0–46.0)
Hemoglobin: 13 g/dL (ref 12.0–15.0)
Lymphocytes Relative: 26 % (ref 12.0–46.0)
Lymphs Abs: 1.8 10*3/uL (ref 0.7–4.0)
MCHC: 33.6 g/dL (ref 30.0–36.0)
MCV: 87 fl (ref 78.0–100.0)
Monocytes Absolute: 0.5 10*3/uL (ref 0.1–1.0)
Monocytes Relative: 6.7 % (ref 3.0–12.0)
Neutro Abs: 4.6 10*3/uL (ref 1.4–7.7)
Neutrophils Relative %: 66 % (ref 43.0–77.0)
Platelets: 325 10*3/uL (ref 150.0–400.0)
RBC: 4.45 Mil/uL (ref 3.87–5.11)
RDW: 12.3 % (ref 11.5–15.5)
WBC: 7 10*3/uL (ref 4.0–10.5)

## 2021-10-27 NOTE — Progress Notes (Signed)
? ?Chief Complaint  ?Patient presents with  ? Annual Exam  ?  fasting  ? ? ?HPI: ?Patient  Megan Warren  21 y.o. 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   ? ?Immuniz utd x hpv. ?Periods nl   no concerns no sa no hx of pap.  ?Needs  tb eval and Hep b titers . ?Has contacts and just saw the eye doc  ?Had c diff in past  no current concerns. ? ? ?Health Maintenance  ?Topic Date Due  ? PAP-Cervical Cytology Screening  04/28/2022 (Originally 05/27/2020)  ? PAP SMEAR-Modifier  04/28/2022 (Originally 05/27/2020)  ? HPV VACCINES (1 - 2-dose series) 04/28/2022 (Originally 05/27/2010)  ? TETANUS/TDAP  04/28/2022 (Originally 05/27/2018)  ? COVID-19 Vaccine (2 - Booster for Janssen series) 04/28/2022 (Originally 11/17/2019)  ? Hepatitis C Screening  04/28/2022 (Originally 05/27/2017)  ? HIV Screening  04/28/2022 (Originally 05/27/2014)  ? INFLUENZA VACCINE  02/09/2022  ? ?Health Maintenance Review ?LIFESTYLE:  ?Exercise:   yes beginning to run ?Tobacco/ETS:n ?Alcohol: n ?Sugar beverages:n ?Sleep:7-8 ?Drug use: no ? ?Work: grad Patent examiner  ? ?ROS:  ?REST of 12 system review negative except as per HPI no cv pulm gi gu sx  ? ? ?Past Medical History:  ?Diagnosis Date  ? Acne   ? Short stature, familial   ? ? ?Past Surgical History:  ?Procedure Laterality Date  ? NO PAST SURGERIES    ? ? ?Family History  ?Problem Relation Age of Onset  ? Short stature Other   ?     sister and mother  ? ? ?Social History  ? ?Socioeconomic History  ? Marital status: Single  ?  Spouse name: Not on file  ? Number of children: 0  ? Years of education: Not on file  ? Highest education level: Not on file  ?Occupational History  ? Occupation: student  ?Tobacco Use  ? Smoking status: Never  ? Smokeless tobacco: Never  ?Vaping Use  ? Vaping Use: Never used  ?Substance and Sexual Activity  ? Alcohol use: No  ? Drug use: No  ? Sexual activity: Never  ?Other  Topics Concern  ? Not on file  ?Social History Narrative  ? Born in [place]  ? HH of 4  ? Senior at [employer] in 2018 then plans to pursue college  ? Pet labrador  ? Neg ets firearms  ? Parents Megan Warren  ? Mom MS pharma   ? 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  ? [employer] student   ? ?Social Determinants of Health  ? ?Financial Resource Strain: Not on file  ?Food Insecurity: Not on file  ?Transportation Needs: Not on file  ?Physical Activity: Not on file  ?Stress: Not on file  ?Social Connections: Not on file  ? ? ?Outpatient Medications Prior to Visit  ?Medication Sig Dispense Refill  ? tretinoin (RETIN-A) 0.025 % cream APPLY DIME SIZE AMOUNT TO ENTIRE FACE AS TOLERATED NIGHTLY    ? Norethindrone-Ethinyl Estradiol-Fe Biphas (LO LOESTRIN FE) 1 MG-10 MCG / 10 MCG tablet Take 1 tablet by mouth daily. 28 tablet 11  ? Probiotic Product (PROBIOTIC-10 PO) Take by mouth in the morning and at bedtime.    ? ?No facility-administered medications prior to visit.  ? ? ? ?EXAM: ? ?BP 110/80 (BP Location: Left Arm, Patient Position: Sitting, Cuff Size: Normal)   Pulse 86   Temp  98.7 ?F (37.1 ?C) (Oral)   Ht 4' 9.5" (1.461 m)   Wt 76 lb 3.2 oz (34.6 kg)   LMP 10/12/2021 (Exact Date)   SpO2 98%   BMI 16.20 kg/m?  ? ?Body mass index is 16.2 kg/m?. ?Wt Readings from Last 3 Encounters:  ?10/27/21 76 lb 3.2 oz (34.6 kg)  ?12/14/19 73 lb (33.1 kg)  ?11/09/19 72 lb 4 oz (32.8 kg)  ? ? ?Physical Exam: ?Vital signs reviewed ?TMA:UQJF is a well-developed well-nourished alert cooperative    who appearsr stated age in no acute distress.  ?HEENT: normocephalic atraumatic , Eyes: PERRL EOM's full, conjunctiva clear, Nares: paten,t no deformity discharge or tenderness., Ears: no deformity EAC's clear TMs with normal landmarks. NECK: supple without masses, thyromegaly or bruits. ?CHEST/PULM:  Clear to auscultation and percussion breath sounds equal no wheeze , rales or rhonchi. No chest wall deformities or tenderness. ?Breast: normal  by inspection . No dimpling, discharge, masses, tenderness or discharge . ?CV: PMI is nondisplaced, S1 S2 no gallops, murmurs, rubs. Peripheral pulses are full without delay.No JVD .  ?ABDOMEN: Bowel sounds normal nontender  No guard or rebound, no hepato splenomegal no CVA tenderness.   ?Extremtities:  No clubbing cyanosis or edema, no acute joint swelling or redness no focal atrophy ?NEURO:  Oriented x3, cranial nerves 3-12 appear to be intact, no obvious focal weakness,gait within normal limits no abnormal reflexes or asymmetrical ?SKIN: No acute rashes normal turgor, color, no bruising or petechiae. ?PSYCH: Oriented, good eye contact, no obvious depression anxiety, cognition and judgment appear normal. ?LN: no cervical axillary inguinal adenopathy ? ?Lab Results  ?Component Value Date  ? WBC 7.0 10/27/2021  ? HGB 13.0 10/27/2021  ? HCT 38.7 10/27/2021  ? PLT 325.0 10/27/2021  ? GLUCOSE 79 10/27/2021  ? CHOL 250 (H) 10/27/2021  ? TRIG 54.0 10/27/2021  ? HDL 95.60 10/27/2021  ? LDLCALC 144 (H) 10/27/2021  ? ALT 11 10/27/2021  ? AST 17 10/27/2021  ? NA 138 10/27/2021  ? K 3.9 10/27/2021  ? CL 103 10/27/2021  ? CREATININE 0.67 10/27/2021  ? BUN 10 10/27/2021  ? CO2 26 10/27/2021  ? TSH 1.64 07/24/2019  ? ? ?BP Readings from Last 3 Encounters:  ?10/27/21 110/80  ?12/14/19 (!) 116/54  ?11/09/19 (!) 90/58  ? ? ?Lab plan reviewed with patient  ? ?ASSESSMENT AND PLAN: ? ?Discussed the following assessment and plan: ? ?  ICD-10-CM   ?1. Visit for preventive health examination  Z00.00 QuantiFERON-TB Gold Plus  ?  Hepatitis B surface antibody,quantitative  ?  HIV Antibody (routine testing w rflx)  ?  Hepatitis C antibody  ?  CBC with Differential/Platelet  ?  Lipid panel  ?  Comprehensive metabolic panel  ?  Comprehensive metabolic panel  ?  Lipid panel  ?  CBC with Differential/Platelet  ?  Hepatitis C antibody  ?  HIV Antibody (routine testing w rflx)  ?  Hepatitis B surface antibody,quantitative  ?  QuantiFERON-TB Gold  Plus  ?  ?2. Immunity status testing  Z01.84 QuantiFERON-TB Gold Plus  ?  Hepatitis B surface antibody,quantitative  ?  HIV Antibody (routine testing w rflx)  ?  Hepatitis C antibody  ?  CBC with Differential/Platelet  ?  Lipid panel  ?  Comprehensive metabolic panel  ?  Comprehensive metabolic panel  ?  Lipid panel  ?  CBC with Differential/Platelet  ?  Hepatitis C antibody  ?  HIV Antibody (routine testing w rflx)  ?  Hepatitis B surface antibody,quantitative  ?  QuantiFERON-TB Gold Plus  ?  ?3. Need for hepatitis C screening test  Z11.59 QuantiFERON-TB Gold Plus  ?  Hepatitis B surface antibody,quantitative  ?  HIV Antibody (routine testing w rflx)  ?  Hepatitis C antibody  ?  CBC with Differential/Platelet  ?  Lipid panel  ?  Comprehensive metabolic panel  ?  Comprehensive metabolic panel  ?  Lipid panel  ?  CBC with Differential/Platelet  ?  Hepatitis C antibody  ?  HIV Antibody (routine testing w rflx)  ?  Hepatitis B surface antibody,quantitative  ?  QuantiFERON-TB Gold Plus  ?  ?4. Lipid screening  Z13.220 QuantiFERON-TB Gold Plus  ?  Hepatitis B surface antibody,quantitative  ?  HIV Antibody (routine testing w rflx)  ?  Hepatitis C antibody  ?  CBC with Differential/Platelet  ?  Lipid panel  ?  Comprehensive metabolic panel  ?  Comprehensive metabolic panel  ?  Lipid panel  ?  CBC with Differential/Platelet  ?  Hepatitis C antibody  ?  HIV Antibody (routine testing w rflx)  ?  Hepatitis B surface antibody,quantitative  ?  QuantiFERON-TB Gold Plus  ?  ?5. Wears contact lenses  Z97.3   ?  ?Disc gardisil vaccine   pap  want to delay hpv No sa  ?Pap at convenient with us appt ( or gyne ) ?Will complete form when lab back  ?Return for depending on results  pap smear . ? ?Patient Care Team: ?Saranya Harlin, Neta MendsWanda K, MD as PCP - General (Internal Medicine) ?Patient Instructions  ?Good to see you today . ?Lab pending. ?Can make appt for pap smear check  ? ?Advise consider HPV vaccine series any time.  ? ? ?Neta MendsWanda K. Nickey Kloepfer  M.D. ? ?

## 2021-10-27 NOTE — Patient Instructions (Signed)
Good to see you today . ?Lab pending. ?Can make appt for pap smear check  ? ?Advise consider HPV vaccine series any time.  ? ?

## 2021-10-30 LAB — HEPATITIS B SURFACE ANTIBODY, QUANTITATIVE: Hep B S AB Quant (Post): <5 m[IU]/mL — ABNORMAL LOW (ref >=10)

## 2021-10-30 LAB — HEPATITIS C ANTIBODY
Hepatitis C Ab: NONREACTIVE
SIGNAL TO CUT-OFF: 0.13 (ref <1.00)

## 2021-10-30 LAB — HIV ANTIBODY (ROUTINE TESTING W REFLEX): HIV 1&2 Ab, 4th Generation: NONREACTIVE

## 2021-10-30 LAB — QUANTIFERON-TB GOLD PLUS
Mitogen-NIL: >10 IU/mL
NIL: 0.02 IU/mL
QuantiFERON-TB Gold Plus: NEGATIVE
TB1-NIL: 0 IU/mL
TB2-NIL: 0 IU/mL

## 2021-10-30 NOTE — Progress Notes (Signed)
Cholesterol up some could be better . Quantiferon   (tb tests)negative .  Hep b titer  is low   . You may want to get a hep b booster  if required .  We can do this at your Fu visit .

## 2021-11-03 ENCOUNTER — Encounter: Payer: Self-pay | Admitting: Internal Medicine

## 2021-11-03 ENCOUNTER — Ambulatory Visit (INDEPENDENT_AMBULATORY_CARE_PROVIDER_SITE_OTHER): Payer: No Typology Code available for payment source | Admitting: Internal Medicine

## 2021-11-03 ENCOUNTER — Other Ambulatory Visit (HOSPITAL_COMMUNITY)
Admission: RE | Admit: 2021-11-03 | Discharge: 2021-11-03 | Disposition: A | Discharge status: Home / Self Care | Payer: No Typology Code available for payment source | Source: Ambulatory Visit | Attending: Internal Medicine | Admitting: Internal Medicine

## 2021-11-03 VITALS — BP 110/70 | HR 102 | Temp 98.5°F | Ht <= 58 in | Wt 76.4 lb

## 2021-11-03 DIAGNOSIS — Z0184 Encounter for antibody response examination: Secondary | ICD-10-CM | POA: Diagnosis not present

## 2021-11-03 DIAGNOSIS — E7800 Pure hypercholesterolemia, unspecified: Secondary | ICD-10-CM | POA: Diagnosis not present

## 2021-11-03 DIAGNOSIS — Z01419 Encounter for gynecological examination (general) (routine) without abnormal findings: Secondary | ICD-10-CM

## 2021-11-03 DIAGNOSIS — Z124 Encounter for screening for malignant neoplasm of cervix: Secondary | ICD-10-CM

## 2021-11-03 DIAGNOSIS — Z23 Encounter for immunization: Secondary | ICD-10-CM

## 2021-11-03 NOTE — Progress Notes (Signed)
? ?Chief Complaint  ?Patient presents with  ? Gynecologic Exam  ?  Discuss  results  ? ? ?HPI: ?Megan Warren 21 y.o. come in for fu labs and gyne exam.  ? ?Lab see results  ?Hep b  serology less than 5   ?Negtive quantiferon ?Lmp April 3 lasted about 1 week ?No se no sx  ?ROS: See pertinent positives and negatives per HPI. ? ?Past Medical History:  ?Diagnosis Date  ? Acne   ? Short stature, familial   ? ? ?Family History  ?Problem Relation Age of Onset  ? Short stature Other   ?     sister and mother  ? ? ?Social History  ? ?Socioeconomic History  ? Marital status: Single  ?  Spouse name: Not on file  ? Number of children: 0  ? Years of education: Not on file  ? Highest education level: Bachelor's degree (e.g., BA, AB, BS)  ?Occupational History  ? Occupation: student  ?Tobacco Use  ? Smoking status: Never  ? Smokeless tobacco: Never  ?Vaping Use  ? Vaping Use: Never used  ?Substance and Sexual Activity  ? Alcohol use: No  ? Drug use: No  ? Sexual activity: Never  ?Other Topics Concern  ? Not on file  ?Social History Narrative  ? Born in [place]  ? HH of 4  ? Senior at [employer] in 2018 then plans to pursue college  ? Pet labrador  ? Neg ets firearms  ? Parents Vandana /Ravi Vanegas  ? Mom MS pharma   ? 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  ? [employer] student   ? ?Social Determinants of Health  ? ?Financial Resource Strain: Low Risk   ? Difficulty of Paying Living Expenses: Not hard at all  ?Food Insecurity: No Food Insecurity  ? Worried About Programme researcher, broadcasting/film/video in the Last Year: Never true  ? Ran Out of Food in the Last Year: Never true  ?Transportation Needs: No Transportation Needs  ? Lack of Transportation (Medical): No  ? Lack of Transportation (Non-Medical): No  ?Physical Activity: Unknown  ? Days of Exercise per Week: 0 days  ? Minutes of Exercise per Session: Not on file  ?Stress: No Stress Concern Present  ? Feeling of Stress : Not at all  ?Social Connections: Moderately Isolated  ? Frequency of  Communication with Friends and Family: More than three times a week  ? Frequency of Social Gatherings with Friends and Family: Patient refused  ? Attends Religious Services: 1 to 4 times per year  ? Active Member of Clubs or Organizations: No  ? Attends Banker Meetings: Not on file  ? Marital Status: Never married  ? ? ?Outpatient Medications Prior to Visit  ?Medication Sig Dispense Refill  ? tretinoin (RETIN-A) 0.025 % cream APPLY DIME SIZE AMOUNT TO ENTIRE FACE AS TOLERATED NIGHTLY    ? ?No facility-administered medications prior to visit.  ? ? ? ?EXAM: ? ?BP 110/70 (BP Location: Left Arm, Patient Position: Sitting, Cuff Size: Normal)   Pulse (!) 102   Temp 98.5 ?F (36.9 ?C) (Oral)   Ht 4' 9.5" (1.461 m)   Wt 76 lb 6.4 oz (34.7 kg)   LMP 10/12/2021 (Exact Date)   SpO2 98%   BMI 16.25 kg/m?  ? ?Body mass index is 16.25 kg/m?. ? ?GENERAL: vitals reviewed and listed above, alert, oriented, appears well hydrated and in no acute distress ?HEENT: atraumatic, conjunctiva  clear, no obvious abnormalities on inspection of  external nose and ears NECK: no obvious masses on inspection palpation  ?LMS: moves all extremities without noticeable focal  abnormality ?Pelvic: NL ext GU, labia clear without lesions or rash . Vagina no lesions fading bump left  upper.Cervix: clear  UTERUS: Neg CMT guarding Adnexa:  clear no masses . PAP done ? ?PSYCH: pleasant and cooperative, no obvious depression or anxiety ?Lab Results  ?Component Value Date  ? WBC 7.0 10/27/2021  ? HGB 13.0 10/27/2021  ? HCT 38.7 10/27/2021  ? PLT 325.0 10/27/2021  ? GLUCOSE 79 10/27/2021  ? CHOL 250 (H) 10/27/2021  ? TRIG 54.0 10/27/2021  ? HDL 95.60 10/27/2021  ? LDLCALC 144 (H) 10/27/2021  ? ALT 11 10/27/2021  ? AST 17 10/27/2021  ? NA 138 10/27/2021  ? K 3.9 10/27/2021  ? CL 103 10/27/2021  ? CREATININE 0.67 10/27/2021  ? BUN 10 10/27/2021  ? CO2 26 10/27/2021  ? TSH 1.64 07/24/2019  ? ?BP Readings from Last 3 Encounters:  ?11/03/21 110/70   ?10/27/21 110/80  ?12/14/19 (!) 116/54  ? ?Reviewed  results with patient  ?ASSESSMENT AND PLAN: ? ?Discussed the following assessment and plan: ? ?Encounter for gynecological examination without abnormal finding - Low risk - Plan: PAP [[facility]] ? ?Elevated LDL cholesterol level ? ?Hepatitis B vaccination administered at current visit ? ?Immunity status testing - Plan: Hepatitis B surface antibody,quantitative ? ?Pap smear for cervical cancer screening ?Patient feels lack of exercise because the elevation of LDL from last baseline plans on increasing activity. ? ?Hep b booster given   Hep lisav 1  ?Repeat in 1 month and then  serology  about   6-8 weeks  ( 1-2 mos) after  injection or as per protocol . ?Form given to patient  ?-Patient advised to return or notify health care team  if  new concerns arise. ?ROV as indicated hep b in a month heplisav    serology order in record can be done 6-8 weeks post ?There are no Patient Instructions on file for this visit. ? ? ?Neta Mends. Jaleeah Slight M.D. ?

## 2021-11-04 DIAGNOSIS — Z23 Encounter for immunization: Secondary | ICD-10-CM | POA: Diagnosis not present

## 2021-11-05 ENCOUNTER — Encounter: Payer: Self-pay | Admitting: Internal Medicine

## 2021-11-05 LAB — CYTOLOGY - PAP
Adequacy: ABSENT
Diagnosis: NEGATIVE

## 2021-11-08 NOTE — Progress Notes (Signed)
PAP is normal. Repeat in 3 years.

## 2021-11-26 ENCOUNTER — Telehealth: Payer: Self-pay | Admitting: Internal Medicine

## 2021-11-26 NOTE — Telephone Encounter (Signed)
Pt dropped off forms to be signed. Pt will be picking them up once signed. Forms are in folder.  Please advise.

## 2021-11-30 NOTE — Telephone Encounter (Signed)
Documents placed in red folder

## 2021-12-02 NOTE — Telephone Encounter (Signed)
I signed both forms folder is on your desk.

## 2021-12-03 NOTE — Telephone Encounter (Signed)
Message was sent to patient forms are ready for pickup

## 2021-12-09 NOTE — Telephone Encounter (Signed)
Noted  

## 2021-12-28 ENCOUNTER — Ambulatory Visit (INDEPENDENT_AMBULATORY_CARE_PROVIDER_SITE_OTHER): Payer: No Typology Code available for payment source

## 2021-12-28 DIAGNOSIS — Z23 Encounter for immunization: Secondary | ICD-10-CM

## 2022-07-14 ENCOUNTER — Encounter: Payer: Self-pay | Admitting: Family Medicine

## 2022-07-14 ENCOUNTER — Other Ambulatory Visit (HOSPITAL_COMMUNITY)
Admission: RE | Admit: 2022-07-14 | Discharge: 2022-07-14 | Disposition: A | Discharge status: Home / Self Care | Payer: No Typology Code available for payment source | Source: Ambulatory Visit | Attending: Family Medicine | Admitting: Family Medicine

## 2022-07-14 ENCOUNTER — Ambulatory Visit (INDEPENDENT_AMBULATORY_CARE_PROVIDER_SITE_OTHER): Payer: No Typology Code available for payment source | Admitting: Family Medicine

## 2022-07-14 VITALS — BP 110/70 | HR 75 | Temp 97.5°F | Ht <= 58 in | Wt 78.5 lb

## 2022-07-14 DIAGNOSIS — N898 Other specified noninflammatory disorders of vagina: Secondary | ICD-10-CM | POA: Insufficient documentation

## 2022-07-14 DIAGNOSIS — L299 Pruritus, unspecified: Secondary | ICD-10-CM | POA: Diagnosis not present

## 2022-07-14 NOTE — Patient Instructions (Signed)
Consider over the counter anti-histamine such as Allegra, Zyrtec, or Xyzal for pruritus symptoms.

## 2022-07-14 NOTE — Progress Notes (Signed)
Established Patient Office Visit  Subjective   Patient ID: Megan Warren, female    DOB: 2000-11-19  Age: 22 y.o. MRN: [id number]  Chief Complaint  Patient presents with   Vaginal Itching    Patient complains of vaginal itching, x2 days,     HPI   Megan Warren is seen with onset a few days ago of some mild vaginal pruritus.  No vaginal discharge.  She then subsequent noticed some intermittent episodes of pruritus involving the scalp and chest and trunk.  She was using a topical for acne on her back but no other new topicals.  She did hold that off a few days ago.  Otherwise no changes soaps or detergents.  Only medication is Retin-A topically.  She has not noted any visible urticaria or rash otherwise.  No fevers or chills.  No recent respiratory symptoms.  Denies any burning with urination.  She has not had any prior history of known yeast vaginitis.  Not sexually active. No history of known food allergies.  Past Medical History:  Diagnosis Date   Acne    Short stature, familial    Past Surgical History:  Procedure Laterality Date   NO PAST SURGERIES      reports that she has never smoked. She has never used smokeless tobacco. She reports that she does not drink alcohol and does not use drugs. family history includes Short stature in an other family member. No Known Allergies  Review of Systems  Constitutional:  Negative for chills and fever.  HENT:  Negative for congestion, ear pain and sore throat.   Respiratory:  Negative for cough and shortness of breath.   Skin:  Positive for itching. Negative for rash.      Objective:     BP 110/70 (BP Location: Left Arm, Patient Position: Sitting, Cuff Size: Normal)   Pulse 75   Temp (!) 97.5 F (36.4 C) (Oral)   Ht 4' 9.5" (1.461 m)   Wt 78 lb 8 oz (35.6 kg)   SpO2 97%   BMI 16.69 kg/m    Physical Exam Vitals reviewed.  Constitutional:      Appearance: Normal appearance.  Cardiovascular:     Rate and Rhythm: Normal  rate and regular rhythm.  Pulmonary:     Effort: Pulmonary effort is normal.     Breath sounds: Normal breath sounds.  Skin:    Comments: No obvious visible rash.  Neurological:     Mental Status: She is alert.      No results found for any visits on 07/14/22.    The ASCVD Risk score (Arnett DK, et al., 2019) failed to calculate for the following reasons:   The 2019 ASCVD risk score is only valid for ages 9 to 17    Assessment & Plan:   Problem List Items Addressed This Visit   None Visit Diagnoses     Vaginal pruritus    -  Primary   Relevant Orders   Urine cytology ancillary only     Patient presents with several day history of pruritus which is intermittent and transient starting in the vaginal area and now more generalized.  No visible rash.  No vaginal discharge.  Low clinical suspicion for yeast vaginitis but will check urine cytology study -Consider over-the-counter antihistamine such as Xyzal or Allegra for persistent symptoms -Be in touch in a few days if symptoms not clearing.  Consider screening labs including chemistries and liver functions if symptoms persist  No follow-ups on  file.    Carolann Littler, MD

## 2022-07-15 ENCOUNTER — Telehealth: Payer: Self-pay | Admitting: Internal Medicine

## 2022-07-15 ENCOUNTER — Encounter: Payer: Self-pay | Admitting: Family Medicine

## 2022-07-15 LAB — URINE CYTOLOGY ANCILLARY ONLY
Bacterial Vaginitis-Urine: NEGATIVE
Candida Urine: NEGATIVE

## 2022-07-15 MED ORDER — FLUCONAZOLE 150 MG PO TABS: 150.0000 mg | ORAL_TABLET | Freq: Once | ORAL | 0 refills | Status: AC

## 2022-07-15 NOTE — Telephone Encounter (Signed)
Patient was advised to call if sx worsened, sx worsened last night, noticed her back got itchy and developed red spots and there is a red rash under her breasts when she gets out of shower, ears are red itchy. Sched OV for 07/16/22. Wants to discuss a prescription for fluconizole

## 2022-07-15 NOTE — Addendum Note (Signed)
Addended by: Nilda Riggs on: 07/15/2022 02:16 PM   Modules accepted: Orders

## 2022-07-15 NOTE — Telephone Encounter (Signed)
Rx sent 

## 2022-07-15 NOTE — Telephone Encounter (Signed)
Patient informed of the message below and rx sent  

## 2022-07-16 ENCOUNTER — Encounter: Payer: Self-pay | Admitting: Family Medicine

## 2022-07-16 ENCOUNTER — Ambulatory Visit (INDEPENDENT_AMBULATORY_CARE_PROVIDER_SITE_OTHER): Payer: No Typology Code available for payment source | Admitting: Family Medicine

## 2022-07-16 VITALS — BP 114/64 | HR 85 | Temp 98.2°F | Ht <= 58 in | Wt 75.9 lb

## 2022-07-16 DIAGNOSIS — L299 Pruritus, unspecified: Secondary | ICD-10-CM | POA: Diagnosis not present

## 2022-07-16 DIAGNOSIS — R21 Rash and other nonspecific skin eruption: Secondary | ICD-10-CM

## 2022-07-16 NOTE — Patient Instructions (Signed)
Continue with the OTC anti-histamine daily as needed  May add over the counter medication such as Pepcid to above if itching persists.

## 2022-07-16 NOTE — Progress Notes (Signed)
   Established Patient Office Visit  Subjective   Patient ID: Megan Warren, female    DOB: 2000/11/02  Age: 22 y.o. MRN: [id number]  Chief Complaint  Patient presents with   Follow-up   Vaginal Pain    HPI   Seen with some pruritus mostly involving her back.  Refer to note from couple days ago.  She initially noted some vaginal pruritus.  Not sexually active.  No vaginal discharge.  Urine cytology was negative.  She was really not having much itching at all at the time of her last visit.  She developed some increased itching especially of her trunk after hot shower.  She does have a little bit of redness (back) but today her symptoms are fully resolved.  We had sent in empiric treatment with 1 fluconazole.  She has not noted any urticaria.  No scaly rash.  No sore throat.  Past Medical History:  Diagnosis Date   Acne    Short stature, familial    Past Surgical History:  Procedure Laterality Date   NO PAST SURGERIES      reports that she has never smoked. She has never used smokeless tobacco. She reports that she does not drink alcohol and does not use drugs. family history includes Short stature in an other family member. No Known Allergies  Review of Systems  Constitutional:  Negative for chills and fever.  HENT:  Negative for ear pain and sore throat.   Respiratory:  Negative for cough.   Genitourinary:  Negative for dysuria.      Objective:     BP 114/64 (BP Location: Left Arm, Patient Position: Sitting, Cuff Size: Normal)   Pulse 85   Temp 98.2 F (36.8 C) (Oral)   Ht 4' 9.5" (1.461 m)   Wt 75 lb 14.4 oz (34.4 kg)   SpO2 99%   BMI 16.14 kg/m    Physical Exam Vitals reviewed.  Constitutional:      Appearance: Normal appearance.  Cardiovascular:     Rate and Rhythm: Normal rate and regular rhythm.  Skin:    Comments: Very faint splotchy macular blanching erythematous rash on her back.  None noted on her extremities.  None on the head or neck.   Nonscaly.  Neurological:     Mental Status: She is alert.      No results found for any visits on 07/16/22.    The ASCVD Risk score (Arnett DK, et al., 2019) failed to calculate for the following reasons:   The 2019 ASCVD risk score is only valid for ages 67 to 71    Assessment & Plan:   Skin rash.  This is a macular and erythematous rash and looks more allergic in nature- or possible post-viral, though lacking and recent respiratory symptoms.  Etiology unclear.  No urticaria at this time. -Continue over-the-counter antihistamine such as Zyrtec or Allegra.  May add H2 blocker such as Pepcid if necessary.  Reviewed the fact that these kinds of rashes are frequently triggered by heat such as hot shower or exercise.  Suspect will clear in a few days.  Evelena Peat, MD

## 2024-08-07 ENCOUNTER — Encounter: Payer: Self-pay | Admitting: Internal Medicine

## 2024-08-07 ENCOUNTER — Ambulatory Visit: Admitting: Internal Medicine

## 2024-08-07 VITALS — BP 102/66 | HR 102 | Temp 98.3°F | Ht <= 58 in | Wt 75.0 lb

## 2024-08-07 DIAGNOSIS — R197 Diarrhea, unspecified: Secondary | ICD-10-CM

## 2024-08-07 DIAGNOSIS — R012 Other cardiac sounds: Secondary | ICD-10-CM | POA: Diagnosis not present

## 2024-08-07 DIAGNOSIS — Z0000 Encounter for general adult medical examination without abnormal findings: Secondary | ICD-10-CM

## 2024-08-07 DIAGNOSIS — Z8619 Personal history of other infectious and parasitic diseases: Secondary | ICD-10-CM

## 2024-08-07 LAB — CBC WITH DIFFERENTIAL/PLATELET
Basophils Absolute: 0.1 10*3/uL (ref 0.0–0.1)
Basophils Relative: 0.7 % (ref 0.0–3.0)
Eosinophils Absolute: 0 10*3/uL (ref 0.0–0.7)
Eosinophils Relative: 0.5 % (ref 0.0–5.0)
HCT: 37.9 % (ref 36.0–46.0)
Hemoglobin: 12.4 g/dL (ref 12.0–15.0)
Lymphocytes Relative: 30.1 % (ref 12.0–46.0)
Lymphs Abs: 2.2 10*3/uL (ref 0.7–4.0)
MCHC: 32.8 g/dL (ref 30.0–36.0)
MCV: 83.3 fl (ref 78.0–100.0)
Monocytes Absolute: 0.7 10*3/uL (ref 0.1–1.0)
Monocytes Relative: 9.5 % (ref 3.0–12.0)
Neutro Abs: 4.4 10*3/uL (ref 1.4–7.7)
Neutrophils Relative %: 59.2 % (ref 43.0–77.0)
Platelets: 288 10*3/uL (ref 150.0–400.0)
RBC: 4.56 Mil/uL (ref 3.87–5.11)
RDW: 14 % (ref 11.5–15.5)
WBC: 7.5 10*3/uL (ref 4.0–10.5)

## 2024-08-07 LAB — BASIC METABOLIC PANEL WITH GFR
BUN: 10 mg/dL (ref 6–23)
CO2: 26 meq/L (ref 19–32)
Calcium: 9.5 mg/dL (ref 8.4–10.5)
Chloride: 104 meq/L (ref 96–112)
Creatinine, Ser: 0.64 mg/dL (ref 0.40–1.20)
GFR: 123.02 mL/min
Glucose, Bld: 78 mg/dL (ref 70–99)
Potassium: 4 meq/L (ref 3.5–5.1)
Sodium: 138 meq/L (ref 135–145)

## 2024-08-07 LAB — C-REACTIVE PROTEIN: CRP: <0.5 mg/dL — ABNORMAL LOW (ref 1.0–20.0)

## 2024-08-07 LAB — LIPID PANEL
Cholesterol: 226 mg/dL — ABNORMAL HIGH (ref 28–200)
HDL: 83.4 mg/dL
LDL Cholesterol: 130 mg/dL — ABNORMAL HIGH (ref 10–99)
NonHDL: 142.12
Total CHOL/HDL Ratio: 3
Triglycerides: 62 mg/dL (ref 10.0–149.0)
VLDL: 12.4 mg/dL (ref 0.0–40.0)

## 2024-08-07 LAB — HEPATIC FUNCTION PANEL
ALT: 9 U/L (ref 3–35)
AST: 15 U/L (ref 5–37)
Albumin: 4.3 g/dL (ref 3.5–5.2)
Alkaline Phosphatase: 37 U/L — ABNORMAL LOW (ref 39–117)
Bilirubin, Direct: 0.1 mg/dL (ref 0.1–0.3)
Total Bilirubin: 0.5 mg/dL (ref 0.2–1.2)
Total Protein: 7.6 g/dL (ref 6.0–8.3)

## 2024-08-07 LAB — SEDIMENTATION RATE: Sed Rate: 14 mm/h (ref 0–20)

## 2024-08-07 LAB — TSH: TSH: 1.53 u[IU]/mL (ref 0.35–5.50)

## 2024-08-07 LAB — VITAMIN B12: Vitamin B-12: 408 pg/mL (ref 211–911)

## 2024-08-07 LAB — T4, FREE: Free T4: 0.73 ng/dL (ref 0.60–1.60)

## 2024-08-07 NOTE — Patient Instructions (Signed)
 Good to see  you today .  Follow cardiac exam  no murmur but heard an extra click sound .  Will review and see if other consideration for fu of the intermittent diarrhea   ever since you had c diff .   Poss bacterial bowel overgrowth issues .   Glad that there are nor systemic symptoms otherwise .  Lab today and go from there .

## 2024-08-07 NOTE — Progress Notes (Signed)
 "  Chief Complaint  Patient presents with   Annual Exam    Pt would like to discuss GI problem that she had discuss with Dr Charlett before. For intermittent diarrhea    HPI: Patient  Megan Warren  24 y.o. comes in today for Preventive Health Care visit  Last pv April 2023  IS well but would like opinion about   recurrent off and on diarrhea without systemic sx .  Occasional diarrhea   can last 3-5 days and then normal     occurs intermittently  and nl in between days.  No different with food s.  Described as Loose   2 -3  x per day .(   2 x per day is normal.pattern ) No nocturnal  or  systemic sx with this . Is it worth testing   for c diff toxin  ?since  had c diff a few years ago 2021    and this pattern developed   after that?      Periods regular and normal.  Feels well  no fever weight changes restrictive diet   Health Maintenance  Topic Date Due   COVID-19 Vaccine (5 - 2025-26 season) 08/23/2024 (Originally 03/12/2024)   DTaP/Tdap/Td (1 - Tdap) 08/07/2025 (Originally 05/27/2018)   HPV VACCINES (1 - 3-dose series) 08/07/2025 (Originally 05/27/2014)   Cervical Cancer Screening (Pap smear)  11/03/2024   Influenza Vaccine  Completed   Hepatitis B Vaccines 19-59 Average Risk  Completed   Hepatitis C Screening  Completed   HIV Screening  Completed   Pneumococcal Vaccine  Aged Out   Meningococcal B Vaccine  Aged Out   Health Maintenance Review LIFESTYLE:  Exercise:   lower  since academics  runs  Tobacco/ETS: n Alcohol: n Sugar beverages: no Sleep: sleep 7-8  Drug use: no HH of  school  1 roommate    studying  [place]  [employer]   comes home for medical home  no pets      ROS:  uses topical retin  no meds otherwise  GEN/ HEENT: No fever, significant weight changes sweats headaches vision problems hearing changes, CV/ PULM; No chest pain shortness of breath cough, syncope,edema  change in exercise tolerance. GI /GU: No adominal pain, vomiting,  No blood in the stool. No  significant GU symptoms. SKIN/HEME: ,no acute skin rashes suspicious lesions or bleeding. No lymphadenopathy, nodules, masses.  NEURO/ PSYCH:  No neurologic signs such as weakness numbness. No depression anxiety. IMM/ Allergy: No unusual infections.  Allergy .   REST of 12 system review negative except as per HPI   Past Medical History:  Diagnosis Date   Acne    Short stature, familial     Past Surgical History:  Procedure Laterality Date   NO PAST SURGERIES      Family History  Problem Relation Age of Onset   Short stature Other        sister and mother    Social History   Socioeconomic History   Marital status: Single    Spouse name: Not on file   Number of children: 0   Years of education: Not on file   Highest education level: Bachelor's degree (e.g., BA, AB, BS)  Occupational History   Occupation: student  Tobacco Use   Smoking status: Never   Smokeless tobacco: Never  Vaping Use   Vaping status: Never Used  Substance and Sexual Activity   Alcohol use: No   Drug use: No   Sexual activity:  Never  Other Topics Concern   Not on file  Social History Narrative   Born in [place]   [facility] 2   Senior at [employer] in 2018 then plans to pursue college   Pet labrador   Neg ets firearms   Parents Vandana Darren Vandoren   Mom MS pharma    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   [employer] student    Social Drivers of Health   Tobacco Use: Low Risk (08/07/2024)   Patient History    Smoking Tobacco Use: Never    Smokeless Tobacco Use: Never    Passive Exposure: Not on file  Financial Resource Strain: Low Risk (10/30/2021)   Overall Financial Resource Strain (CARDIA)    Difficulty of Paying Living Expenses: Not hard at all  Food Insecurity: No Food Insecurity (10/30/2021)   Hunger Vital Sign    Worried About Running Out of Food in the Last Year: Never true    Ran Out of Food in the Last Year: Never true  Transportation Needs: No Transportation Needs (10/30/2021)   PRAPARE -  Administrator, Civil Service (Medical): No    Lack of Transportation (Non-Medical): No  Physical Activity: Unknown (10/30/2021)   Exercise Vital Sign    Days of Exercise per Week: 0 days    Minutes of Exercise per Session: Not on file  Stress: No Stress Concern Present (10/30/2021)   [employer] of Occupational Health - Occupational Stress Questionnaire    Feeling of Stress : Not at all  Social Connections: Moderately Isolated (10/30/2021)   Social Connection and Isolation Panel    Frequency of Communication with Friends and Family: More than three times a week    Frequency of Social Gatherings with Friends and Family: Patient declined    Attends Religious Services: 1 to 4 times per year    Active Member of [employer] or Organizations: No    Attends Engineer, Structural: Not on file    Marital Status: Never married  Depression (PHQ2-9): Low Risk (08/07/2024)   Depression (PHQ2-9)    PHQ-2 Score: 0  Alcohol Screen: Not on file  Housing: Medium Risk (10/30/2021)   Housing    Last Housing Risk Score: 1  Utilities: Not on file  Health Literacy: Not on file    Outpatient Medications Prior to Visit  Medication Sig Dispense Refill   tazarotene (TAZORAC) 0.05 % cream Apply topically.     tretinoin (RETIN-A) 0.025 % cream APPLY DIME SIZE AMOUNT TO ENTIRE FACE AS TOLERATED NIGHTLY (Patient not taking: Reported on 08/07/2024)     No facility-administered medications prior to visit.     EXAM:  BP 102/66 (BP Location: Left Arm, Patient Position: Sitting, Cuff Size: Normal)   Pulse (!) 102   Temp 98.3 F (36.8 C) (Oral)   Ht 4' 7.51 (1.41 m)   Wt 75 lb (34 kg)   LMP 07/19/2024 (Exact Date)   SpO2 99%   BMI 17.11 kg/m   Body mass index is 17.11 kg/m. Wt Readings from Last 3 Encounters:  08/07/24 75 lb (34 kg)  07/16/22 75 lb 14.4 oz (34.4 kg)  07/14/22 78 lb 8 oz (35.6 kg)    Physical Exam: Vital signs reviewed HZW:Uypd is a well-developed well-nourished  alert cooperative    who appearsr stated age in no acute distress.  HEENT: normocephalic atraumatic , Eyes: PERRL EOM's full, conjunctiva clear, Nares: paten,t no deformity discharge or tenderness., Ears: no deformity EAC's clear TMs with normal  landmarks. Mouth: clear OP, no lesions, edema.  Moist mucous membranes. Dentition in adequate repair. NECK: supple without masses, thyromegaly or bruits. CHEST/PULM:  Clear to auscultation and percussion breath sounds equal no wheeze , rales or rhonchi. No chest wall deformities or tenderness. Breast: normal by inspection . No dimpling, discharge, masses, tenderness or discharge . CV: PMI is nondisplaced, S1 S2 no gallops, murmurs, rubs I do hear a mid systolic sound click crunchy sound in sitting and supine position but no murmur  noted . Peripheral pulses are full without delay.No JVD .  ABDOMEN: Bowel sounds normal nontender  No guard or rebound, no hepato splenomegal no CVA tenderness.   Extremtities:  No clubbing cyanosis or edema, no acute joint swelling or redness no focal atrophy NEURO:  Oriented x3, cranial nerves 3-12 appear to be intact, no obvious focal weakness,gait within normal limits no abnormal reflexes or asymmetrical SKIN: No acute rashes normal turgor, color, no bruising or petechiae. PSYCH: Oriented, good eye contact, no obvious depression anxiety, cognition and judgment appear normal. LN: no cervical axillary adenopathy  Lab Results  Component Value Date   WBC 7.5 08/07/2024   HGB 12.4 08/07/2024   HCT 37.9 08/07/2024   PLT 288.0 08/07/2024   GLUCOSE 78 08/07/2024   CHOL 226 (H) 08/07/2024   TRIG 62.0 08/07/2024   HDL 83.40 08/07/2024   LDLCALC 130 (H) 08/07/2024   ALT 9 08/07/2024   AST 15 08/07/2024   NA 138 08/07/2024   K 4.0 08/07/2024   CL 104 08/07/2024   CREATININE 0.64 08/07/2024   BUN 10 08/07/2024   CO2 26 08/07/2024   TSH 1.53 08/07/2024    BP Readings from Last 3 Encounters:  08/07/24 102/66  07/16/22  114/64  07/14/22 110/70    Lab plan reviewed with patient  Non fasting  had noodles plus for lunch and water .   ASSESSMENT AND PLAN:  Discussed the following assessment and plan:    ICD-10-CM   1. Visit for preventive health examination  Z00.00 CBC with Differential/Platelet    Lipid panel    TSH    Basic metabolic panel with GFR    Hepatic function panel    T4, free    Sedimentation rate    C-reactive protein    Vitamin B12    2. Intermittent diarrhea  R19.7 CBC with Differential/Platelet    Lipid panel    TSH    Basic metabolic panel with GFR    Hepatic function panel    T4, free    Sedimentation rate    C-reactive protein    Vitamin B12    3. History of Clostridioides difficile infection  Z86.19 CBC with Differential/Platelet    Lipid panel    TSH    Basic metabolic panel with GFR    Hepatic function panel    T4, free    Sedimentation rate    C-reactive protein    Vitamin B12    4. Mid-systolic click  R01.2     Sound like post infectious intermittent diarrhea without systemic sx and a psat hx of c diff .  Will consider other options for eval and or intervention  . Fortunately no systemic sx at this time  however  her initial sx   for c diff were surprisingly mild to  -moderate ( may be  metronidazole   therapy that failed?) but had  persistent diarrheal presentation and abd sx at the time .  Mid systolic cardiac sound may be insignificant  no sx and no murmur but follow exam  indicated .  Disc  if  persistent or progressive consider echo.  Return for depending on results and how doing.  Patient Care Team: Arik Husmann K, MD as PCP - General (Internal Medicine) Patient Instructions  Good to see  you today .  Follow cardiac exam  no murmur but heard an extra click sound .  Will review and see if other consideration for fu of the intermittent diarrhea   ever since you had c diff .   Poss bacterial bowel overgrowth issues .   Glad that there are nor systemic  symptoms otherwise .  Lab today and go from there .  Megan Warren K. Allante Beane M.D.  "
